# Patient Record
Sex: Female | Born: 1980 | Race: Black or African American | Hispanic: No | Marital: Married | Smoking: Never smoker
Health system: Southern US, Community
[De-identification: ages and names within clinical notes are randomized; demographics above are authoritative.]

## PROBLEM LIST (undated history)

## (undated) HISTORY — PX: TONSILLECTOMY: SUR1361

---

## 2012-12-19 ENCOUNTER — Emergency Department (HOSPITAL_BASED_OUTPATIENT_CLINIC_OR_DEPARTMENT_OTHER)
Admission: EM | Admit: 2012-12-19 | Discharge: 2012-12-19 | Disposition: A | Discharge status: Home / Self Care | Payer: 59 | Attending: Emergency Medicine | Admitting: Emergency Medicine

## 2012-12-19 ENCOUNTER — Encounter (HOSPITAL_BASED_OUTPATIENT_CLINIC_OR_DEPARTMENT_OTHER): Payer: Self-pay | Admitting: Emergency Medicine

## 2012-12-19 DIAGNOSIS — L259 Unspecified contact dermatitis, unspecified cause: Secondary | ICD-10-CM | POA: Insufficient documentation

## 2012-12-19 DIAGNOSIS — L299 Pruritus, unspecified: Secondary | ICD-10-CM | POA: Insufficient documentation

## 2012-12-19 MED ORDER — HYDROXYZINE HCL 25 MG PO TABS: 25.0000 mg | ORAL_TABLET | Freq: Four times a day (QID) | ORAL | Status: AC

## 2012-12-19 MED ORDER — HYDROCORTISONE 1 % EX LOTN: TOPICAL_LOTION | Freq: Two times a day (BID) | CUTANEOUS | Status: AC

## 2012-12-19 NOTE — ED Provider Notes (Signed)
History     CSN: [id number]  Arrival date & time 12/19/12  1139   First MD Initiated Contact with Patient 12/19/12 1210      Chief Complaint  Patient presents with  . Rash    (Consider location/radiation/quality/duration/timing/severity/associated sxs/prior treatment) Patient is a 32 y.o. female presenting with rash. The history is provided by the patient.  Rash  This is a new problem. Episode onset: 2-3 days ago. The problem has not changed since onset.Associated with: new lotion, bath & body  There has been no fever. Affected Location: right upper extremity, back  The pain is at a severity of 0/10. The patient is experiencing no pain. The pain has been constant since onset. Associated symptoms include itching. Pertinent negatives include no blisters, no pain and no weeping. She has tried antihistamines for the symptoms. The treatment provided mild relief.    No past medical history on file.  No past surgical history on file.  No family history on file.  History  Substance Use Topics  . Smoking status: Not on file  . Smokeless tobacco: Not on file  . Alcohol Use: Not on file    OB History    Grav Para Term Preterm Abortions TAB SAB Ect Mult Living                  Review of Systems  Constitutional: Negative for fever, diaphoresis and activity change.  HENT: Negative for congestion and neck pain.   Respiratory: Negative for cough.   Genitourinary: Negative for dysuria.  Musculoskeletal: Negative for myalgias.  Skin: Positive for itching and rash. Negative for color change and wound.  Neurological: Negative for headaches.  All other systems reviewed and are negative.    Allergies  Review of patient's allergies indicates no known allergies.  Home Medications   Current Outpatient Rx  Name  Route  Sig  Dispense  Refill  . DIPHENHYDRAMINE HCL 25 MG PO TABS   Oral   Take 25 mg by mouth every 6 (six) hours as needed.         Marland Kitchen DIPHENHYDRAMINE-ZINC ACETATE  1-0.1 % EX CREA   Topical   Apply 1 application topically 3 (three) times daily as needed.         Marland Kitchen HYDROCORTISONE 1 % EX LOTN   Topical   Apply topically 2 (two) times daily.   118 mL   0   . HYDROXYZINE HCL 25 MG PO TABS   Oral   Take 1 tablet (25 mg total) by mouth every 6 (six) hours.   12 tablet   0     BP 122/82  Pulse 68  Temp 98.3 F (36.8 C)  Resp 18  Ht 5\' 6"  (1.676 m)  Wt 142 lb (64.411 kg)  BMI 22.92 kg/m2  SpO2 100%  LMP 12/12/2012  Physical Exam  Nursing note and vitals reviewed. Constitutional: She is oriented to person, place, and time. She appears well-developed and well-nourished. No distress.  HENT:  Head: Normocephalic and atraumatic.  Eyes: Conjunctivae normal and EOM are normal.  Neck: Normal range of motion.  Pulmonary/Chest: Effort normal.  Musculoskeletal: Normal range of motion.  Neurological: She is alert and oriented to person, place, and time.  Skin: Skin is warm and dry. Rash noted. She is not diaphoretic.        papules with erythematous base.  No weeping, crusting or urticaria.    Psychiatric: She has a normal mood and affect. Her behavior is normal.  ED Course  Procedures (including critical care time)  Labs Reviewed - No data to display No results found.   1. Contact dermatitis       MDM  Contact Dermatitis Pt dc with atarax and hydrocortisone lotion. At this time there does not appear to be any evidence of an acute emergency medical condition and the patient appears stable for discharge with appropriate outpatient follow up.Diagnosis was discussed with patient who verbalizes understanding and is agreeable to discharge.         Jaci Carrel, [place] 12/19/12 1314

## 2012-12-19 NOTE — ED Notes (Signed)
Fine rash to back arms and chest.  Pt does have itching.  Pt has been using a different lotion.

## 2012-12-20 NOTE — ED Provider Notes (Signed)
History/physical exam/procedure(s) were performed by non-physician practitioner and as supervising physician I was immediately available for consultation/collaboration. I have reviewed all notes and am in agreement with care and plan.   Hilario Quarry, MD 12/20/12 [phone]

## 2015-04-29 ENCOUNTER — Emergency Department (HOSPITAL_BASED_OUTPATIENT_CLINIC_OR_DEPARTMENT_OTHER)
Admission: EM | Admit: 2015-04-29 | Discharge: 2015-04-29 | Disposition: A | Discharge status: Other Acute Inpatient Hospital | Payer: BLUE CROSS/BLUE SHIELD | Attending: Emergency Medicine | Admitting: Emergency Medicine

## 2015-04-29 ENCOUNTER — Encounter (HOSPITAL_BASED_OUTPATIENT_CLINIC_OR_DEPARTMENT_OTHER): Payer: Self-pay | Admitting: Emergency Medicine

## 2015-04-29 DIAGNOSIS — R1084 Generalized abdominal pain: Secondary | ICD-10-CM | POA: Diagnosis not present

## 2015-04-29 DIAGNOSIS — N898 Other specified noninflammatory disorders of vagina: Secondary | ICD-10-CM | POA: Insufficient documentation

## 2015-04-29 DIAGNOSIS — Z3A18 18 weeks gestation of pregnancy: Secondary | ICD-10-CM | POA: Insufficient documentation

## 2015-04-29 DIAGNOSIS — Z7952 Long term (current) use of systemic steroids: Secondary | ICD-10-CM | POA: Diagnosis not present

## 2015-04-29 DIAGNOSIS — Z79899 Other long term (current) drug therapy: Secondary | ICD-10-CM | POA: Diagnosis not present

## 2015-04-29 DIAGNOSIS — Z8619 Personal history of other infectious and parasitic diseases: Secondary | ICD-10-CM | POA: Insufficient documentation

## 2015-04-29 DIAGNOSIS — O9989 Other specified diseases and conditions complicating pregnancy, childbirth and the puerperium: Secondary | ICD-10-CM | POA: Diagnosis present

## 2015-04-29 DIAGNOSIS — R1031 Right lower quadrant pain: Secondary | ICD-10-CM

## 2015-04-29 LAB — COMPREHENSIVE METABOLIC PANEL
ALT: 17 U/L (ref 14–54)
ANION GAP: 5 (ref 5–15)
AST: 16 U/L (ref 15–41)
Albumin: 3.3 g/dL — ABNORMAL LOW (ref 3.5–5.0)
Alkaline Phosphatase: 44 U/L (ref 38–126)
BILIRUBIN TOTAL: 0.6 mg/dL (ref 0.3–1.2)
BUN: 9 mg/dL (ref 6–20)
CHLORIDE: 105 mmol/L (ref 101–111)
CO2: 23 mmol/L (ref 22–32)
CREATININE: 0.57 mg/dL (ref 0.44–1.00)
Calcium: 8.9 mg/dL (ref 8.9–10.3)
GFR calc Af Amer: >60 mL/min (ref >60)
GLUCOSE: 83 mg/dL (ref 65–99)
POTASSIUM: 3.8 mmol/L (ref 3.5–5.1)
SODIUM: 133 mmol/L — AB (ref 135–145)
TOTAL PROTEIN: 7 g/dL (ref 6.5–8.1)

## 2015-04-29 LAB — URINALYSIS, ROUTINE W REFLEX MICROSCOPIC
Bilirubin Urine: NEGATIVE
GLUCOSE, UA: NEGATIVE mg/dL
HGB URINE DIPSTICK: NEGATIVE
KETONES UR: 15 mg/dL — AB
NITRITE: NEGATIVE
PROTEIN: NEGATIVE mg/dL
SPECIFIC GRAVITY, URINE: 1.028 (ref 1.005–1.030)
Urobilinogen, UA: 1 mg/dL (ref 0.0–1.0)
pH: 6.5 (ref 5.0–8.0)

## 2015-04-29 LAB — CBC WITH DIFFERENTIAL/PLATELET
Basophils Absolute: 0 10*3/uL (ref 0.0–0.1)
Basophils Relative: 0 % (ref 0–1)
Eosinophils Absolute: 0.2 10*3/uL (ref 0.0–0.7)
Eosinophils Relative: 2 % (ref 0–5)
HCT: 33.8 % — ABNORMAL LOW (ref 36.0–46.0)
HEMOGLOBIN: 11.3 g/dL — AB (ref 12.0–15.0)
LYMPHS PCT: 14 % (ref 12–46)
Lymphs Abs: 1.6 10*3/uL (ref 0.7–4.0)
MCH: 29.5 pg (ref 26.0–34.0)
MCHC: 33.4 g/dL (ref 30.0–36.0)
MCV: 88.3 fL (ref 78.0–100.0)
MONOS PCT: 10 % (ref 3–12)
Monocytes Absolute: 1 10*3/uL (ref 0.1–1.0)
NEUTROS ABS: 8.1 10*3/uL — AB (ref 1.7–7.7)
NEUTROS PCT: 74 % (ref 43–77)
Platelets: 279 10*3/uL (ref 150–400)
RBC: 3.83 MIL/uL — AB (ref 3.87–5.11)
RDW: 12.8 % (ref 11.5–15.5)
WBC: 10.9 10*3/uL — AB (ref 4.0–10.5)

## 2015-04-29 LAB — WET PREP, GENITAL
Trich, Wet Prep: NONE SEEN
YEAST WET PREP: NONE SEEN

## 2015-04-29 LAB — URINE MICROSCOPIC-ADD ON

## 2015-04-29 LAB — PREGNANCY, URINE: PREG TEST UR: POSITIVE — AB

## 2015-04-29 MED ORDER — SODIUM CHLORIDE 0.9 % IV BOLUS (SEPSIS)
1000.0000 mL | Freq: Once | INTRAVENOUS | Status: AC
  Administered 2015-04-29: 1000 mL via INTRAVENOUS

## 2015-04-29 MED ORDER — ACETAMINOPHEN 325 MG PO TABS
650.0000 mg | ORAL_TABLET | Freq: Once | ORAL | Status: AC
  Administered 2015-04-29: 650 mg via ORAL
  Filled 2015-04-29: qty 2

## 2015-04-29 NOTE — ED Provider Notes (Signed)
CSN: [id number]     Arrival date & time 04/29/15  1420 History  This chart was scribed for Rolan Bucco, MD by Octavia Heir, ED Scribe. This patient was seen in room MH04/MH04 and the patient's care was started at 4:18 PM.    Chief Complaint  Patient presents with  . Abdominal Pain      The history is provided by the patient. No language interpreter was used.    HPI Comments: Sherry Wolf is a 34 y.o. female who is [redacted] weeks pregnant presents to the Emergency Department complaining of intermittent, gradual worsening cramping onset 2 days ago. Pt reports she follows up with regularly with her OB/GYN at [facility] ob/[facility]yn in [place]. Pt notes she has had a minor yeast and bacterial infection and was given medication about one month ago. She says her eating and drinking has not changed. Pt denies bleeding, nausea, vomiting, discharge, fevers and trouble urinating.  History reviewed. No pertinent past medical history. History reviewed. No pertinent past surgical history. History reviewed. No pertinent family history. History  Substance Use Topics  . Smoking status: Never Smoker   . Smokeless tobacco: Not on file  . Alcohol Use: No   OB History    Gravida Para Term Preterm AB TAB SAB Ectopic Multiple Living   Review of Systems  Constitutional: Negative for fever, chills, diaphoresis and fatigue.  HENT: Negative for congestion, rhinorrhea and sneezing.   Eyes: Negative.   Respiratory: Negative for cough, chest tightness and shortness of breath.   Cardiovascular: Negative for chest pain and leg swelling.  Gastrointestinal: Positive for abdominal pain. Negative for nausea, vomiting, diarrhea and blood in stool.  Genitourinary: Negative for frequency, hematuria, flank pain, vaginal bleeding, vaginal discharge and difficulty urinating.  Musculoskeletal: Negative for back pain and arthralgias.  Skin: Negative for rash.  Neurological: Negative for dizziness, speech  difficulty, weakness, numbness and headaches.      Allergies  Review of patient's allergies indicates no known allergies.  Home Medications   Prior to Admission medications   Medication Sig Start Date End Date Taking? Authorizing Provider  Prenatal Vit-Fe Fumarate-FA (PRENATAL MULTIVITAMIN) TABS tablet Take 1 tablet by mouth daily at 12 noon.   Yes Historical Provider, MD  diphenhydrAMINE (BENADRYL) 25 MG tablet Take 25 mg by mouth every 6 (six) hours as needed.    Historical Provider, MD  diphenhydrAMINE-zinc acetate (BENADRYL) cream Apply 1 application topically 3 (three) times daily as needed.    Historical Provider, MD  hydrocortisone 1 % lotion Apply topically 2 (two) times daily. 12/19/12   Lisette Paz, PA-C  hydrOXYzine (ATARAX/VISTARIL) 25 MG tablet Take 1 tablet (25 mg total) by mouth every 6 (six) hours. 12/19/12   Lisette Paz, PA-C   Triage vitals: BP 126/74 mmHg  Pulse 110  Temp(Src) 99.3 F (37.4 C) (Oral)  Resp 18  Ht  (1.676 m)  Wt 165 lb (74.844 kg)  BMI 26.64 kg/m2  SpO2 100%  LMP 12/27/2014 (Exact Date) Physical Exam  Constitutional: She is oriented to person, place, and time. She appears well-developed and well-nourished.  HENT:  Head: Normocephalic and atraumatic.  Eyes: Pupils are equal, round, and reactive to light.  Neck: Normal range of motion. Neck supple.  Cardiovascular: Normal rate, regular rhythm and normal heart sounds.   Pulmonary/Chest: Effort normal and breath sounds normal. No respiratory distress. She has no wheezes. She has no rales. She  exhibits no tenderness.  Abdominal: Soft. Bowel sounds are normal. There is tenderness. There is no rebound and no guarding.  Gravid uterus with fundus just below the umbilicus. She has mild diffuse tenderness across the lower abdomen.  Genitourinary: Vaginal discharge found.  There is a moderate amount of grayish white discharge. The cervix is closed with some generalized tenderness around palpation of  the cervix and lower abdomen but no specific cervical motion tenderness  Musculoskeletal: Normal range of motion. She exhibits no edema.  Lymphadenopathy:    She has no cervical adenopathy.  Neurological: She is alert and oriented to person, place, and time.  Skin: Skin is warm and dry. No rash noted.  Psychiatric: She has a normal mood and affect.    ED Course  Procedures  DIAGNOSTIC STUDIES: Oxygen Saturation is 100% on RA, normal by my interpretation.  COORDINATION OF CARE:  4:22 PM Discussed treatment plan which includes pelvic exam, IV fluids with pt at bedside and pt agreed to plan.    Labs Review Labs Reviewed  WET PREP, GENITAL - Abnormal; Notable for the following:    Clue Cells Wet Prep HPF POC TOO NUMEROUS TO COUNT (*)    WBC, Wet Prep HPF POC FEW (*)    All other components within normal limits  URINALYSIS, ROUTINE W REFLEX MICROSCOPIC (NOT AT [facility]) - Abnormal; Notable for the following:    Color, Urine AMBER (*)    APPearance CLOUDY (*)    Ketones, ur 15 (*)    Leukocytes, UA SMALL (*)    All other components within normal limits  PREGNANCY, URINE - Abnormal; Notable for the following:    Preg Test, Ur POSITIVE (*)    All other components within normal limits  URINE MICROSCOPIC-ADD ON - Abnormal; Notable for the following:    Squamous Epithelial / LPF FEW (*)    Bacteria, UA FEW (*)    All other components within normal limits  COMPREHENSIVE METABOLIC PANEL - Abnormal; Notable for the following:    Sodium 133 (*)    Albumin 3.3 (*)    All other components within normal limits  CBC WITH DIFFERENTIAL/PLATELET - Abnormal; Notable for the following:    WBC 10.9 (*)    RBC 3.83 (*)    Hemoglobin 11.3 (*)    HCT 33.8 (*)    Neutro Abs 8.1 (*)    All other components within normal limits  GC/CHLAMYDIA PROBE AMP ([place]) NOT AT [facility]    Imaging Review No results found.   EKG Interpretation None      MDM   Final diagnoses:  Right lower quadrant  abdominal pain    Patient is given IV fluids. She has persistent pain in her right lower abdomen on reexam. She states the cramping is better but her pain is still there primarily in her right lower abdomen. Her pelvic exam showed some discharge and positive clue cells. She has a mild elevation in her white blood cell count. Her temperature is 99.3. I spoke with her on-call OB/GYN, Dr. Loreta AveWagner who suggested that the patient go to [facility] ED to have a specific ultrasound of her appendix and her right ovary. I contacted the ultrasound tech here to see if were able to do the appendix ultrasound here but she is only allowed to do this ultrasound of up to 35 years of age. She did contact the radiologist who states that the patient would need an MRI to assess her appendix. I will  go ahead and send the patient to [facility] regional ED for further evaluation. They can contact Dr. Loreta Ave following the ultrasound. Dr. Elliot Gurney, the ED physcian at [facility] has accepted the pt for transfer.  The patient's significant other will drive her. We will leave the IV in place. She's been instructed to drive directly to [facility] regional. She's been advised not to eat or drink anything.  I personally performed the services described in this documentation, which was scribed in my presence.  The recorded information has been reviewed and considered.   Rolan Bucco, MD 04/29/15 1910

## 2015-04-29 NOTE — ED Notes (Signed)
EDP Dr. Fredderick PhenixBelfi confirms that pt will go by POV to [facility] ED to meet with her OB for US. Plan initiated. IV will remain in place.

## 2015-04-29 NOTE — ED Notes (Signed)
Pt alert, NAD, calm, interactive, no dyspnea noted, "feel better", "pain decreased", (denies: sob, nausea, dizziness or other sx). Transfer by POV consent signed.

## 2015-04-29 NOTE — ED Notes (Signed)
Pt out with s.o. To go by POV to [facility] ED. Paperwork given in sealed envelope to hand carry.

## 2015-04-29 NOTE — ED Notes (Signed)
Patient reports she is [redacted] weeks pregnant.  Reports cramping.  Denies bleeding.  Reports this began Friday evening.

## 2015-04-30 LAB — GC/CHLAMYDIA PROBE AMP (~~LOC~~) NOT AT ARMC
CHLAMYDIA, DNA PROBE: NEGATIVE
Neisseria Gonorrhea: NEGATIVE

## 2016-12-03 ENCOUNTER — Emergency Department (HOSPITAL_BASED_OUTPATIENT_CLINIC_OR_DEPARTMENT_OTHER)
Admission: EM | Admit: 2016-12-03 | Discharge: 2016-12-03 | Disposition: A | Discharge status: Home / Self Care | Payer: BLUE CROSS/BLUE SHIELD | Attending: Emergency Medicine | Admitting: Emergency Medicine

## 2016-12-03 ENCOUNTER — Encounter (HOSPITAL_BASED_OUTPATIENT_CLINIC_OR_DEPARTMENT_OTHER): Payer: Self-pay | Admitting: *Deleted

## 2016-12-03 DIAGNOSIS — Z79899 Other long term (current) drug therapy: Secondary | ICD-10-CM | POA: Insufficient documentation

## 2016-12-03 DIAGNOSIS — J069 Acute upper respiratory infection, unspecified: Secondary | ICD-10-CM | POA: Insufficient documentation

## 2016-12-03 DIAGNOSIS — R05 Cough: Secondary | ICD-10-CM | POA: Diagnosis present

## 2016-12-03 MED ORDER — ONDANSETRON 4 MG PO TBDP: 4.0000 mg | ORAL_TABLET | Freq: Three times a day (TID) | ORAL | 0 refills | Status: AC | PRN

## 2016-12-03 MED ORDER — ACETAMINOPHEN 500 MG PO TABS
1000.0000 mg | ORAL_TABLET | Freq: Once | ORAL | Status: AC
  Administered 2016-12-03: 1000 mg via ORAL
  Filled 2016-12-03: qty 2

## 2016-12-03 MED ORDER — NAPROXEN 500 MG PO TABS: 500.0000 mg | ORAL_TABLET | Freq: Two times a day (BID) | ORAL | 0 refills | Status: AC

## 2016-12-03 MED ORDER — BENZONATATE 100 MG PO CAPS: 100.0000 mg | ORAL_CAPSULE | Freq: Three times a day (TID) | ORAL | 0 refills | Status: AC

## 2016-12-03 NOTE — ED Notes (Signed)
ED Provider at bedside. 

## 2016-12-03 NOTE — ED Notes (Signed)
Pt given rx x 3 and note for work

## 2016-12-03 NOTE — Discharge Instructions (Signed)
Your symptoms are consistent with a viral illness. Viruses do not require antibiotics. Treatment is symptomatic care and it is important to note that these symptoms may last for 7-10 days. Drink plenty of fluids and get plenty of rest. You should be drinking at least half a liter of water an hour to stay hydrated. Electrolyte drinks are also encouraged. Ibuprofen, Naproxen, or Tylenol for pain or fever. Zofran for nausea. Tessalon for cough. Plain Mucinex or Sudafed may help relieve congestion. Warm liquids or Chloraseptic spray may help soothe a sore throat. Follow up with a primary care provider, as needed, for any future management of this issue.

## 2016-12-03 NOTE — ED Provider Notes (Signed)
[facility] Provider Note   CSN: [id number] Arrival date & time: 12/03/16  1744  By signing my name below, I, Modena JanskyAlbert Thayil, attest that this documentation has been prepared under the direction and in the presence of non-physician practitioner, Harolyn RutherfordShawn Joy, PA-C. Electronically Signed: Modena JanskyAlbert Thayil, Scribe. 12/03/2016. 8:10 PM.  History   Chief Complaint Chief Complaint  Patient presents with  . URI   The history is provided by the patient. No language interpreter was used.   HPI Comments: Sherry Wolf is a 36 y.o. female who presents to the Emergency Department complaining of intermittent moderate nonproductive cough that started yesterday. No treatment PTA. She reports associated symptoms of generalized myaligas, subjective fever, sore throat, and headache. Denies shortness of breath, chest pain, difficulty swallowing, nausea/vomiting, or any other complaints.   History reviewed. No pertinent past medical history.  There are no active problems to display for this patient.   History reviewed. No pertinent surgical history.  OB History    Gravida Para Term Preterm AB Living   2       1     SAB TAB Ectopic Multiple Live Births   1               Home Medications    Prior to Admission medications   Medication Sig Start Date End Date Taking? Authorizing Provider  acetaminophen (TYLENOL) 500 MG tablet Take 1,000 mg by mouth every 6 (six) hours as needed.   Yes Historical Provider, MD  benzonatate (TESSALON) 100 MG capsule Take 1 capsule (100 mg total) by mouth every 8 (eight) hours. 12/03/16   Shawn C Joy, PA-C  diphenhydrAMINE (BENADRYL) 25 MG tablet Take 25 mg by mouth every 6 (six) hours as needed.    Historical Provider, MD  diphenhydrAMINE-zinc acetate (BENADRYL) cream Apply 1 application topically 3 (three) times daily as needed.    Historical Provider, MD  hydrocortisone 1 % lotion Apply topically 2 (two) times daily. 12/19/12   Lisette Paz, PA-C  hydrOXYzine  (ATARAX/VISTARIL) 25 MG tablet Take 1 tablet (25 mg total) by mouth every 6 (six) hours. 12/19/12   Lisette Paz, PA-C  naproxen (NAPROSYN) 500 MG tablet Take 1 tablet (500 mg total) by mouth 2 (two) times daily. 12/03/16   Shawn C Joy, PA-C  ondansetron (ZOFRAN ODT) 4 MG disintegrating tablet Take 1 tablet (4 mg total) by mouth every 8 (eight) hours as needed for nausea or vomiting. 12/03/16   Anselm PancoastShawn C Joy, PA-C    Family History No family history on file.  Social History Social History  Substance Use Topics  . Smoking status: Never Smoker  . Smokeless tobacco: Not on file  . Alcohol use No     Allergies   Patient has no known allergies.   Review of Systems Review of Systems  Constitutional: Positive for fever.  HENT: Positive for sore throat.   Respiratory: Positive for cough.   Gastrointestinal: Negative for diarrhea and vomiting.  Neurological: Positive for headaches.  All other systems reviewed and are negative.    Physical Exam Updated Vital Signs BP 147/96 (BP Location: Right Arm)   Pulse 112   Temp 101.7 F (38.7 C) (Oral) Comment: EMT Valencia-Rojas obtained vitals  Resp 20   Ht 5\' 5"  (1.651 m)   Wt 160 lb (72.6 kg)   LMP 11/18/2016   SpO2 100%   BMI 26.63 kg/m   Physical Exam  Constitutional: She appears well-developed and well-nourished. No distress.  HENT:  Head: Normocephalic  and atraumatic.  Eyes: Conjunctivae are normal.  Neck: Neck supple.  Cardiovascular: Normal rate, regular rhythm, normal heart sounds and intact distal pulses.   Pulmonary/Chest: Effort normal and breath sounds normal. No respiratory distress.  Abdominal: Soft. There is no tenderness. There is no guarding.  Musculoskeletal: She exhibits no edema.  Lymphadenopathy:    She has no cervical adenopathy.  Neurological: She is alert.  Skin: Skin is warm and dry. She is not diaphoretic.  Psychiatric: She has a normal mood and affect. Her behavior is normal.  Nursing note and vitals  reviewed.    ED Treatments / Results  DIAGNOSTIC STUDIES: Oxygen Saturation is 100% on RA, normal by my interpretation.    COORDINATION OF CARE: 8:15 PM- Pt advised of plan for treatment and pt agrees.  Labs (all labs ordered are listed, but only abnormal results are displayed) Labs Reviewed - No data to display  EKG  EKG Interpretation None       Radiology No results found.  Procedures Procedures (including critical care time)  Medications Ordered in ED Medications  acetaminophen (TYLENOL) tablet 1,000 mg (1,000 mg Oral Given 12/03/16 1908)     Initial Impression / Assessment and Plan / ED Course  I have reviewed the triage vital signs and the nursing notes.  Pertinent labs & imaging results that were available during my care of the patient were reviewed by me and considered in my medical decision making (see chart for details).  Clinical Course     Patient presents with symptoms consistent with viral syndrome. Patient is nontoxic appearing. Home care and return precautions discussed.   Vitals:   12/03/16 1757 12/03/16 1758 12/03/16 2019  BP:  147/96 126/89  Pulse:  112 104  Resp:  20 18  Temp:  101.7 F (38.7 C) 99.3 F (37.4 C)  TempSrc:  Oral Oral  SpO2:  100% 98%  Weight: 72.6 kg    Height: 5\' 5"  (1.651 m)       Final Clinical Impressions(s) / ED Diagnoses   Final diagnoses:  Upper respiratory tract infection, unspecified type    New Prescriptions Discharge Medication List as of 12/03/2016  8:16 PM    START taking these medications   Details  benzonatate (TESSALON) 100 MG capsule Take 1 capsule (100 mg total) by mouth every 8 (eight) hours., Starting Wed 12/03/2016, Print    naproxen (NAPROSYN) 500 MG tablet Take 1 tablet (500 mg total) by mouth 2 (two) times daily., Starting Wed 12/03/2016, Print    ondansetron (ZOFRAN ODT) 4 MG disintegrating tablet Take 1 tablet (4 mg total) by mouth every 8 (eight) hours as needed for nausea or  vomiting., Starting Wed 12/03/2016, Print       I personally performed the services described in this documentation, which was scribed in my presence. The recorded information has been reviewed and is accurate.    Anselm Pancoast, PA-C 12/06/16 0240    Marily Memos, MD 12/08/16 [phone]

## 2016-12-03 NOTE — ED Triage Notes (Signed)
Pt co URi symptoms x 1 day 

## 2018-05-28 ENCOUNTER — Emergency Department (HOSPITAL_BASED_OUTPATIENT_CLINIC_OR_DEPARTMENT_OTHER)
Admission: EM | Admit: 2018-05-28 | Discharge: 2018-05-28 | Disposition: A | Discharge status: Home / Self Care | Payer: Managed Care, Other (non HMO) | Attending: Emergency Medicine | Admitting: Emergency Medicine

## 2018-05-28 ENCOUNTER — Encounter (HOSPITAL_BASED_OUTPATIENT_CLINIC_OR_DEPARTMENT_OTHER): Payer: Self-pay

## 2018-05-28 ENCOUNTER — Emergency Department (HOSPITAL_BASED_OUTPATIENT_CLINIC_OR_DEPARTMENT_OTHER): Payer: Managed Care, Other (non HMO)

## 2018-05-28 ENCOUNTER — Other Ambulatory Visit: Payer: Self-pay

## 2018-05-28 DIAGNOSIS — S99922A Unspecified injury of left foot, initial encounter: Secondary | ICD-10-CM | POA: Diagnosis present

## 2018-05-28 DIAGNOSIS — Z79899 Other long term (current) drug therapy: Secondary | ICD-10-CM | POA: Insufficient documentation

## 2018-05-28 DIAGNOSIS — S92515A Nondisplaced fracture of proximal phalanx of left lesser toe(s), initial encounter for closed fracture: Secondary | ICD-10-CM | POA: Diagnosis not present

## 2018-05-28 DIAGNOSIS — Y939 Activity, unspecified: Secondary | ICD-10-CM | POA: Diagnosis not present

## 2018-05-28 DIAGNOSIS — Y929 Unspecified place or not applicable: Secondary | ICD-10-CM | POA: Diagnosis not present

## 2018-05-28 DIAGNOSIS — X58XXXA Exposure to other specified factors, initial encounter: Secondary | ICD-10-CM | POA: Insufficient documentation

## 2018-05-28 DIAGNOSIS — Y999 Unspecified external cause status: Secondary | ICD-10-CM | POA: Diagnosis not present

## 2018-05-28 MED ORDER — ACETAMINOPHEN 500 MG PO TABS
1000.0000 mg | ORAL_TABLET | Freq: Once | ORAL | Status: AC
  Administered 2018-05-28: 1000 mg via ORAL
  Filled 2018-05-28: qty 2

## 2018-05-28 NOTE — Discharge Instructions (Signed)
Keep your toes taped together at all times for 4-6 weeks. Wear the shoe until it does not hurt to walk on it. Use ice 3-4 times daily alternating 20 min on, 20 min off. Please follow up with Dr. Pearletha ForgeHudnall within 7-10 days for follow up. Please return to the emergency department if you develop any new or worsening symptoms.

## 2018-05-28 NOTE — ED Provider Notes (Signed)
[facility] EMERGENCY DEPARTMENT Provider Note   CSN: [id number] Arrival date & time: 05/28/18  1218     History   Chief Complaint Chief Complaint  Patient presents with  . Toe Injury    HPI Sherry Wolf is a 37 y.o. female  who was previously healthy who presents with left toe pain.  Patient reports she was wearing foot flops by before yesterday and had some pain at at bedtime last night but had worsening pain this morning.  She has had associated swelling and redness.  She reports she was drinking alcohol yesterday and may have hit her toe or stop her toe and forgotten.  She has not tried anything at home prior to arrival.  Patient  pregnant 2 years ago, so tetanus believed to be up-to-date.  HPI  History reviewed. No pertinent past medical history.  There are no active problems to display for this patient.   History reviewed. No pertinent surgical history.   OB History    Gravida  2   Para      Term      Preterm      AB  1   Living        SAB  1   TAB      Ectopic      Multiple      Live Births               Home Medications    Prior to Admission medications   Medication Sig Start Date End Date Taking? Authorizing Provider  acetaminophen (TYLENOL) 500 MG tablet Take 1,000 mg by mouth every 6 (six) hours as needed.    [provider]  benzonatate (TESSALON) 100 MG capsule Take 1 capsule (100 mg total) by mouth every 8 (eight) hours. 12/03/16   Joy, Shawn C, PA-C  diphenhydrAMINE (BENADRYL) 25 MG tablet Take 25 mg by mouth every 6 (six) hours as needed.    [provider]  diphenhydrAMINE-zinc acetate (BENADRYL) cream Apply 1 application topically 3 (three) times daily as needed.    [provider]  hydrocortisone 1 % lotion Apply topically 2 (two) times daily. 12/19/12   Paz, Earlie Raveling, PA-C  hydrOXYzine (ATARAX/VISTARIL) 25 MG tablet Take 1 tablet (25 mg total) by mouth every 6 (six) hours. 12/19/12   Paz, Lisette,  PA-C  naproxen (NAPROSYN) 500 MG tablet Take 1 tablet (500 mg total) by mouth 2 (two) times daily. 12/03/16   Joy, Shawn C, PA-C  ondansetron (ZOFRAN ODT) 4 MG disintegrating tablet Take 1 tablet (4 mg total) by mouth every 8 (eight) hours as needed for nausea or vomiting. 12/03/16   Joy, Hillard Danker, PA-C    Family History No family history on file.  Social History Social History   Tobacco Use  . Smoking status: Never Smoker  . Smokeless tobacco: Never Used  Substance Use Topics  . Alcohol use: Yes    Comment: weekly  . Drug use: No     Allergies   Patient has no known allergies.   Review of Systems Review of Systems  Constitutional: Negative for fever.  Musculoskeletal: Positive for arthralgias and joint swelling.  Skin: Positive for wound (Very superficial, 1st epidermal layer abrasion).  Neurological: Negative for numbness.     Physical Exam Updated Vital Signs BP (!) 135/93 (BP Location: Left Arm)   Pulse 76   Temp 98.2 F (36.8 C) (Oral)   Resp 16   Ht 5\' 5"  (1.651 m)  Wt 65.8 kg (145 lb)   LMP 05/19/2018   SpO2 100%   Breastfeeding? Unknown   BMI 24.13 kg/m   Physical Exam  Constitutional: She appears well-developed and well-nourished. No distress.  HENT:  Head: Normocephalic and atraumatic.  Mouth/Throat: Oropharynx is clear and moist. No oropharyngeal exudate.  Eyes: Pupils are equal, round, and reactive to light. Conjunctivae are normal. Right eye exhibits no discharge. Left eye exhibits no discharge. No scleral icterus.  Neck: Normal range of motion.  Cardiovascular: Normal rate, regular rhythm, normal heart sounds and intact distal pulses. Exam reveals no gallop and no friction rub.  No murmur heard. Pulmonary/Chest: Effort normal and breath sounds normal. No stridor. No respiratory distress. She has no wheezes. She has no rales.  Musculoskeletal: She exhibits no edema.  Mild edema and erythema noted to left second toe over the PIP, very superficial,  first epidermal layer, no bleeding abrasion over the area of tenderness, sensation intact, cap refill less than 2 seconds, no tenderness elsewhere on the foot or ankle, DP pulse intact  Neurological: She is alert. Coordination normal.  Skin: Skin is warm and dry. No rash noted. She is not diaphoretic. No pallor.  Psychiatric: She has a normal mood and affect.  Nursing note and vitals reviewed.    ED Treatments / Results  Labs (all labs ordered are listed, but only abnormal results are displayed) Labs Reviewed - No data to display  EKG None  Radiology Dg Toe 2nd Left  Result Date: 05/28/2018 CLINICAL DATA:  Left second toe injury last night. Pain and swelling. Initial encounter. EXAM: LEFT SECOND TOE COMPARISON:  None. FINDINGS: There is a 1 mm osseous fragment at the dorsal aspect of the second toe PIP joint consistent with an avulsion fracture with the donor site not well demonstrated. There is no dislocation. No focal soft tissue abnormality is seen. IMPRESSION: Tiny avulsion fracture at the dorsal aspect of the second toe PIP joint. Electronically Signed   By: Sebastian AcheAllen  Grady M.D.   On: 05/28/2018 12:55    Procedures Procedures (including critical care time)  Medications Ordered in ED Medications  acetaminophen (TYLENOL) tablet 1,000 mg (1,000 mg Oral Given 05/28/18 1322)     Initial Impression / Assessment and Plan / ED Course  I have reviewed the triage vital signs and the nursing notes.  Pertinent labs & imaging results that were available during my care of the patient were reviewed by me and considered in my medical decision making (see chart for details).     Patient with tiny avulsion fracture at the dorsal aspect of the second toe PIP joint.  Patient placed in postop shoe and buddy taped.  Will have patient follow-up with Dr. Pearletha ForgeHudnall for follow-up.  Supportive treatment discussed including ice, NSAIDs, Tylenol for pain control.  Return precautions discussed.  Patient  understands and agrees with plan.  Patient vitals stable throughout ED course and discharged in satisfactory condition.  Final Clinical Impressions(s) / ED Diagnoses   Final diagnoses:  Closed nondisplaced fracture of proximal phalanx of lesser toe of left foot, initial encounter    ED Discharge Orders    None       Emi HolesLaw, Addi Pak M, PA-C 05/28/18 1436    Gwyneth SproutPlunkett, Whitney, MD 05/28/18 1523

## 2018-05-28 NOTE — ED Triage Notes (Signed)
Pt c/o injury to left 2nd toe last night-no break in skin noted-redness noted-NAD-steady gait

## 2019-08-21 ENCOUNTER — Other Ambulatory Visit: Payer: Self-pay

## 2019-08-21 ENCOUNTER — Encounter (HOSPITAL_BASED_OUTPATIENT_CLINIC_OR_DEPARTMENT_OTHER): Payer: Self-pay | Admitting: Emergency Medicine

## 2019-08-21 ENCOUNTER — Emergency Department (HOSPITAL_BASED_OUTPATIENT_CLINIC_OR_DEPARTMENT_OTHER): Payer: Managed Care, Other (non HMO)

## 2019-08-21 ENCOUNTER — Emergency Department (HOSPITAL_BASED_OUTPATIENT_CLINIC_OR_DEPARTMENT_OTHER)
Admission: EM | Admit: 2019-08-21 | Discharge: 2019-08-21 | Disposition: A | Discharge status: Home / Self Care | Payer: Managed Care, Other (non HMO) | Attending: Emergency Medicine | Admitting: Emergency Medicine

## 2019-08-21 DIAGNOSIS — S161XXA Strain of muscle, fascia and tendon at neck level, initial encounter: Secondary | ICD-10-CM | POA: Insufficient documentation

## 2019-08-21 DIAGNOSIS — S199XXA Unspecified injury of neck, initial encounter: Secondary | ICD-10-CM | POA: Diagnosis present

## 2019-08-21 DIAGNOSIS — Y9389 Activity, other specified: Secondary | ICD-10-CM | POA: Diagnosis not present

## 2019-08-21 DIAGNOSIS — Z79899 Other long term (current) drug therapy: Secondary | ICD-10-CM | POA: Insufficient documentation

## 2019-08-21 DIAGNOSIS — Y998 Other external cause status: Secondary | ICD-10-CM | POA: Insufficient documentation

## 2019-08-21 DIAGNOSIS — Y92410 Unspecified street and highway as the place of occurrence of the external cause: Secondary | ICD-10-CM | POA: Insufficient documentation

## 2019-08-21 MED ORDER — CYCLOBENZAPRINE HCL 10 MG PO TABS: 10.0000 mg | ORAL_TABLET | Freq: Two times a day (BID) | ORAL | 0 refills | Status: AC | PRN
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
## 2019-08-21 NOTE — Discharge Instructions (Signed)
Ibuprofen 600 mg every 6 hours as needed for pain.  Flexeril as prescribed as needed for pain not relieved with ibuprofen.  Follow-up with primary doctor if symptoms or not improving in the next week.

## 2019-08-21 NOTE — ED Notes (Signed)
Pt asking about xrays, informed pt that EDP will go over results as soon as possible.

## 2019-08-21 NOTE — ED Notes (Signed)
Pt given Rx x 1 for flexeril. Ambulated to d/c window without difficulty

## 2019-08-21 NOTE — ED Notes (Signed)
Patient ambulated to X-ray 
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
## 2020-08-27 ENCOUNTER — Emergency Department (HOSPITAL_BASED_OUTPATIENT_CLINIC_OR_DEPARTMENT_OTHER)
Admission: EM | Admit: 2020-08-27 | Discharge: 2020-08-27 | Disposition: A | Discharge status: Home / Self Care | Payer: Self-pay

## 2020-08-27 ENCOUNTER — Other Ambulatory Visit: Payer: Self-pay

## 2024-10-28 ENCOUNTER — Encounter (HOSPITAL_BASED_OUTPATIENT_CLINIC_OR_DEPARTMENT_OTHER): Payer: Self-pay

## 2024-10-28 ENCOUNTER — Emergency Department (HOSPITAL_BASED_OUTPATIENT_CLINIC_OR_DEPARTMENT_OTHER)

## 2024-10-28 ENCOUNTER — Other Ambulatory Visit: Payer: Self-pay

## 2024-10-28 ENCOUNTER — Emergency Department (HOSPITAL_BASED_OUTPATIENT_CLINIC_OR_DEPARTMENT_OTHER)
Admission: EM | Admit: 2024-10-28 | Discharge: 2024-10-28 | Disposition: A | Discharge status: Home / Self Care | Attending: Emergency Medicine | Admitting: Emergency Medicine

## 2024-10-28 DIAGNOSIS — R2242 Localized swelling, mass and lump, left lower limb: Secondary | ICD-10-CM | POA: Insufficient documentation

## 2024-10-28 DIAGNOSIS — R2241 Localized swelling, mass and lump, right lower limb: Secondary | ICD-10-CM

## 2024-10-28 MED ORDER — ACETAMINOPHEN 325 MG PO TABS
650.0000 mg | ORAL_TABLET | Freq: Once | ORAL | Status: AC
  Administered 2024-10-28: 650 mg via ORAL
  Filled 2024-10-28: qty 2

## 2024-10-28 MED ORDER — NAPROXEN 500 MG PO TABS: 500.0000 mg | ORAL_TABLET | Freq: Two times a day (BID) | ORAL | 0 refills | Status: AC

## 2024-10-28 MED ORDER — LIDOCAINE 5 % EX PTCH: 1.0000 | MEDICATED_PATCH | CUTANEOUS | 0 refills | Status: AC

## 2024-10-28 MED ORDER — NAPROXEN 250 MG PO TABS
500.0000 mg | ORAL_TABLET | Freq: Once | ORAL | Status: AC
  Administered 2024-10-28: 500 mg via ORAL
  Filled 2024-10-28: qty 2

## 2024-10-28 NOTE — ED Provider Notes (Signed)
 [place] EMERGENCY DEPARTMENT AT [facility] [place] Provider Note   CSN: [id number] Arrival date & time: 10/28/24  8195     Patient presents with: Foot Pain   Sherry Wolf is a 43 y.o. female.   Foot Pain  Patient is a 43 year old female presenting today for concerns for mass to plantar region of left foot, distal, midline.  Noted to be to be noticed 1 week ago and has been progressively more tender and growing.  Describes the pain as being sharp and shooting radiating to the heel.  Has been using foot inserts with minimal relief as well as has been rolling out with ice water bottle which has had minimal relief as well.  Using ibuprofen with also minimal relief.  Denies fever, numbness, weakness, tingling.     Prior to Admission medications   Medication Sig Start Date End Date Taking? Authorizing Provider  lidocaine  (LIDODERM ) 5 % Place 1 patch onto the skin daily. Remove & Discard patch within 12 hours or as directed by MD 10/28/24  Yes Beola, Kristy Catoe S, PA-C  naproxen  (NAPROSYN ) 500 MG tablet Take 1 tablet (500 mg total) by mouth 2 (two) times daily. 10/28/24  Yes Monda Chastain S, PA-C  acetaminophen  (TYLENOL ) 500 MG tablet Take 1,000 mg by mouth every 6 (six) hours as needed.    [provider]  benzonatate  (TESSALON ) 100 MG capsule Take 1 capsule (100 mg total) by mouth every 8 (eight) hours. 12/03/16   Joy, Shawn C, PA-C  cyclobenzaprine  (FLEXERIL ) 10 MG tablet Take 1 tablet (10 mg total) by mouth 2 (two) times daily as needed for muscle spasms. 08/21/19   Geroldine Berg, MD  diphenhydrAMINE (BENADRYL) 25 MG tablet Take 25 mg by mouth every 6 (six) hours as needed.    [provider]  diphenhydrAMINE-zinc acetate (BENADRYL) cream Apply 1 application topically 3 (three) times daily as needed.    [provider]  hydrocortisone  1 % lotion Apply topically 2 (two) times daily. 12/19/12   Paz, Lisette, PA-C  hydrOXYzine  (ATARAX /VISTARIL ) 25 MG tablet  Take 1 tablet (25 mg total) by mouth every 6 (six) hours. 12/19/12   Paz, Lisette, PA-C  naproxen  (NAPROSYN ) 500 MG tablet Take 1 tablet (500 mg total) by mouth 2 (two) times daily. 12/03/16   Joy, Shawn C, PA-C  ondansetron  (ZOFRAN  ODT) 4 MG disintegrating tablet Take 1 tablet (4 mg total) by mouth every 8 (eight) hours as needed for nausea or vomiting. 12/03/16   Joy, Shawn C, PA-C    Allergies: Patient has no known allergies.    Review of Systems  Musculoskeletal:  Positive for myalgias.       Mass to midfoot of left foot.  All other systems reviewed and are negative.   Updated Vital Signs BP (!) 137/94 (BP Location: Left Arm)   Pulse 67   Temp 99 F (37.2 C)   Resp 16   LMP 10/10/2024 (Exact Date)   SpO2 97%   Physical Exam Vitals and nursing note reviewed.  Constitutional:      General: She is not in acute distress.    Appearance: Normal appearance. She is not ill-appearing or diaphoretic.  HENT:     Head: Normocephalic and atraumatic.  Eyes:     General: No scleral icterus.       Right eye: No discharge.        Left eye: No discharge.     Extraocular Movements: Extraocular movements intact.     Conjunctiva/sclera: Conjunctivae normal.  Cardiovascular:     Rate and Rhythm: Normal rate and regular rhythm.     Pulses: Normal pulses.     Heart sounds: Normal heart sounds. No murmur heard.    No friction rub. No gallop.  Pulmonary:     Effort: Pulmonary effort is normal. No respiratory distress.     Breath sounds: No stridor. No wheezing, rhonchi or rales.  Chest:     Chest wall: No tenderness.  Abdominal:     General: Abdomen is flat. There is no distension.     Palpations: Abdomen is soft.     Tenderness: There is no abdominal tenderness. There is no right CVA tenderness, left CVA tenderness, guarding or rebound.  Musculoskeletal:        General: Tenderness present. No swelling, deformity or signs of injury.     Cervical back: Normal range of motion. No rigidity.      Right lower leg: No edema.     Left lower leg: No edema.     Comments: Noted to have small approximately half centimeter mass noted to distal end of plantar region of left foot midline.  Noted to be very tender to palpation.  No erythema, bruising or decreased sensation.  DP pulse 2+.  Able to have full range of motion at ankle and toes.  Good cap refill.  Skin:    General: Skin is warm and dry.     Findings: No bruising, erythema or lesion.  Neurological:     General: No focal deficit present.     Mental Status: She is alert and oriented to person, place, and time. Mental status is at baseline.     Sensory: No sensory deficit.     Motor: No weakness.  Psychiatric:        Mood and Affect: Mood normal.     (all labs ordered are listed, but only abnormal results are displayed) Labs Reviewed - No data to display  EKG: None  Radiology: DG Foot Complete Left Result Date: 10/28/2024 CLINICAL DATA:  Left foot mass midline plantar region, tender to palpation EXAM: LEFT FOOT - COMPLETE 3+ VIEW COMPARISON:  05/28/2018 FINDINGS: Frontal, oblique, and lateral views of the left foot are obtained. No acute displaced fracture, subluxation, or dislocation. Mild osteoarthritis of the first metatarsophalangeal joint. Remaining joint spaces are well preserved. Soft tissues are unremarkable. IMPRESSION: 1. Mild osteoarthritis of the first MTP joint. 2. Otherwise unremarkable left foot. Electronically Signed   By: Ozell Daring M.D.   On: 10/28/2024 19:48    .Ultrasound ED Soft Tissue  Date/Time: 10/28/2024 9:35 PM  Performed by: Beola Terrall RAMAN, PA-C Authorized by: Beola Terrall RAMAN, PA-C   Procedure details:    Indications: limb pain, localization of abscess, evaluate for cellulitis and evaluate for foreign body     Indications comment:  Evaluate mass   Transverse view:  Visualized   Longitudinal view:  Visualized   Images: archived   Location:    Location: lower extremity     Side:   Left Comments:     Appears to be hypoechoic mass structure, suggestive of possible fibroma    Medications Ordered in the ED  acetaminophen  (TYLENOL ) tablet 650 mg (650 mg Oral Given 10/28/24 1941)  naproxen  (NAPROSYN ) tablet 500 mg (500 mg Oral Given 10/28/24 2129)  Medical Decision Making Amount and/or Complexity of Data Reviewed Radiology: ordered.  Risk OTC drugs. Prescription drug management.   This patient is a 43 year old female who presents to the ED for concern of left foot mass causing pain when walking that she noticed x 1 week.  On physical exam, patient is in no acute distress, afebrile, alert and orient x 4, speaking in full sentences, nontachypneic, nontachycardic.  Notably has a proximately 1 cm mass noted to the distal plantar aspect of left foot that is tender to outpatient with no erythema, bruising, skin changes.  Is very nodular but tender to touch.  Exam is otherwise unremarkable.  Low suspicion for any infectious etiology at this time.  Will x-ray foot to evaluate for possible foreign body or osseous abnormality.  X-ray was unremarkable.  Ultrasound did reveal a hyperechoic mass measuring approximate 1 cm.  Suggestive of possible fibroma versus ganglion cyst.  Will have her continue follow-up with orthopedic surgery sending her home with naproxen  and lidocaine  patch.  Patient vital signs have remained stable throughout the course of patient's time in the ED. Low suspicion for any other emergent pathology at this time. I believe this patient is safe to be discharged. Provided strict return to ER precautions. Patient expressed agreement and understanding of plan. All questions were answered.  Differential diagnoses prior to evaluation: The emergent differential diagnosis includes, but is not limited to, plantar fibroma, plantar fasciitis, vertigo, ganglion cyst, plantar callus, sarcoma. This is not an exhaustive differential.   Past  Medical History / Co-morbidities / Social History: No chronic past medical history  Additional history: Chart reviewed.   Lab Tests/Imaging studies: I personally interpreted labs/imaging and the pertinent results include:   X-ray of foot shows mild osteoarthritis with no other acute abnormality I agree with the radiologist interpretation.  Medications: I ordered medication including naproxen , lidocaine  patch, Tylenol .  I have reviewed the patients home medicines and have made adjustments as needed.  Critical Interventions: None  Social Determinants of Health: Does not PCP  Disposition: After consideration of the diagnostic results and the patients response to treatment, I feel that the patient would benefit from discharge and treatment as above.   emergency department workup does not suggest an emergent condition requiring admission or immediate intervention beyond what has been performed at this time. The plan is: Follow-up with orthopedic surgery, symptomatic management at home. The patient is safe for discharge and has been instructed to return immediately for worsening symptoms, change in symptoms or any other concerns.   Final diagnoses:  Mass of right foot    ED Discharge Orders          Ordered    naproxen  (NAPROSYN ) 500 MG tablet  2 times daily        10/28/24 2135    lidocaine  (LIDODERM ) 5 %  Every 24 hours        10/28/24 2135               Beola Terrall RAMAN, PA-C 10/28/24 2207    Kingsley, Victoria K, DO 10/28/24 2324

## 2024-10-28 NOTE — ED Triage Notes (Addendum)
 Pt noticed knot on sole of left foot last Thursday. Increase pain with weight bearing and ambulation. Now right  foot is becoming sore

## 2024-10-28 NOTE — Discharge Instructions (Addendum)
 Seen today for mass to the bottom part of your right foot.  This is most likely either a fibroma or a ganglion cyst.  You will need to continue to follow-up with orthopedic surgery for further management.  Additionally recommend you continue to follow-up with your PCP for additional resources.  You can take anti-inflammatories as needed.  As well as taking Tylenol  and using a lidocaine  patch as needed over the area to help with pain.  Take Tylenol  (acetominophen)  650mg  every 4-6 hours, as needed for pain or fever. Do not take more than 4,000 mg in a 24-hour period. As this may cause liver damage. While this is rare, if you begin to develop yellowing of the skin or eyes, stop taking and return to ER immediately.  Please take Naprosyn , 500mg  by mouth twice daily as needed for pain - this in an antiinflammatory medicine (NSAID) and is similar to ibuprofen - many people feel that it is stronger than ibuprofen and it is easier to take since it is a smaller pill.  Please use this only for 1 week - if your pain persists, you will need to follow up with your doctor in the office for ongoing guidance and pain control.     Additionally you can use lidocaine  patches for further relief.  Please follow-up with orthopedic surgery.
